# Patient Record
Sex: Female | Born: 1955 | Race: White | Hispanic: No | Marital: Married | Smoking: Former smoker
Health system: Southern US, Community
[De-identification: ages and names within clinical notes are randomized; demographics above are authoritative.]

## PROBLEM LIST (undated history)

## (undated) DIAGNOSIS — E7800 Pure hypercholesterolemia, unspecified: Secondary | ICD-10-CM

## (undated) DIAGNOSIS — N200 Calculus of kidney: Secondary | ICD-10-CM

## (undated) DIAGNOSIS — R7303 Prediabetes: Secondary | ICD-10-CM

## (undated) DIAGNOSIS — I10 Essential (primary) hypertension: Secondary | ICD-10-CM

## (undated) DIAGNOSIS — R079 Chest pain, unspecified: Secondary | ICD-10-CM

## (undated) HISTORY — PX: NECK SURGERY: SHX720

## (undated) HISTORY — DX: Essential (primary) hypertension: I10

## (undated) HISTORY — PX: EYE SURGERY: SHX253

## (undated) HISTORY — PX: LITHOTRIPSY: SUR834

## (undated) HISTORY — DX: Chest pain, unspecified: R07.9

---

## 2001-03-05 ENCOUNTER — Encounter: Admission: RE | Admit: 2001-03-05 | Discharge: 2001-03-05 | Payer: Self-pay | Admitting: Emergency Medicine

## 2001-03-05 ENCOUNTER — Encounter: Payer: Self-pay | Admitting: Emergency Medicine

## 2001-08-12 ENCOUNTER — Other Ambulatory Visit: Admission: RE | Admit: 2001-08-12 | Discharge: 2001-08-12 | Payer: Self-pay | Admitting: Emergency Medicine

## 2003-01-30 ENCOUNTER — Other Ambulatory Visit: Admission: RE | Admit: 2003-01-30 | Discharge: 2003-01-30 | Payer: Self-pay | Admitting: Emergency Medicine

## 2003-04-02 ENCOUNTER — Encounter: Admission: RE | Admit: 2003-04-02 | Discharge: 2003-04-02 | Payer: Self-pay | Admitting: Emergency Medicine

## 2004-02-12 ENCOUNTER — Other Ambulatory Visit: Admission: RE | Admit: 2004-02-12 | Discharge: 2004-02-12 | Payer: Self-pay | Admitting: Emergency Medicine

## 2004-09-19 ENCOUNTER — Encounter: Admission: RE | Admit: 2004-09-19 | Discharge: 2004-09-19 | Payer: Self-pay | Admitting: Emergency Medicine

## 2004-10-06 ENCOUNTER — Encounter: Admission: RE | Admit: 2004-10-06 | Discharge: 2005-01-04 | Payer: Self-pay | Admitting: Neurosurgery

## 2005-02-14 ENCOUNTER — Encounter: Admission: RE | Admit: 2005-02-14 | Discharge: 2005-02-14 | Payer: Self-pay | Admitting: Emergency Medicine

## 2005-02-15 ENCOUNTER — Other Ambulatory Visit: Admission: RE | Admit: 2005-02-15 | Discharge: 2005-02-15 | Payer: Self-pay | Admitting: *Deleted

## 2006-03-20 ENCOUNTER — Other Ambulatory Visit: Admission: RE | Admit: 2006-03-20 | Discharge: 2006-03-20 | Payer: Self-pay | Admitting: *Deleted

## 2006-04-26 ENCOUNTER — Ambulatory Visit (HOSPITAL_COMMUNITY): Admission: RE | Admit: 2006-04-26 | Discharge: 2006-04-26 | Payer: Self-pay | Admitting: Urology

## 2006-08-13 ENCOUNTER — Encounter: Admission: RE | Admit: 2006-08-13 | Discharge: 2006-08-13 | Payer: Self-pay | Admitting: Emergency Medicine

## 2007-07-09 ENCOUNTER — Other Ambulatory Visit: Admission: RE | Admit: 2007-07-09 | Discharge: 2007-07-09 | Payer: Self-pay | Admitting: *Deleted

## 2007-08-21 ENCOUNTER — Other Ambulatory Visit: Admission: RE | Admit: 2007-08-21 | Discharge: 2007-08-21 | Payer: Self-pay | Admitting: *Deleted

## 2007-09-09 ENCOUNTER — Encounter: Admission: RE | Admit: 2007-09-09 | Discharge: 2007-09-09 | Payer: Self-pay | Admitting: Emergency Medicine

## 2008-02-22 IMAGING — CR DG ABDOMEN 1V
1 series · 1 of 1 positions shown · non-contrast
Comparison: None.

CLINICAL DATA: Pre-lithotripsy for right renal stone.
 ABDOMEN ? 1 VIEW:

[t abdomen supine]
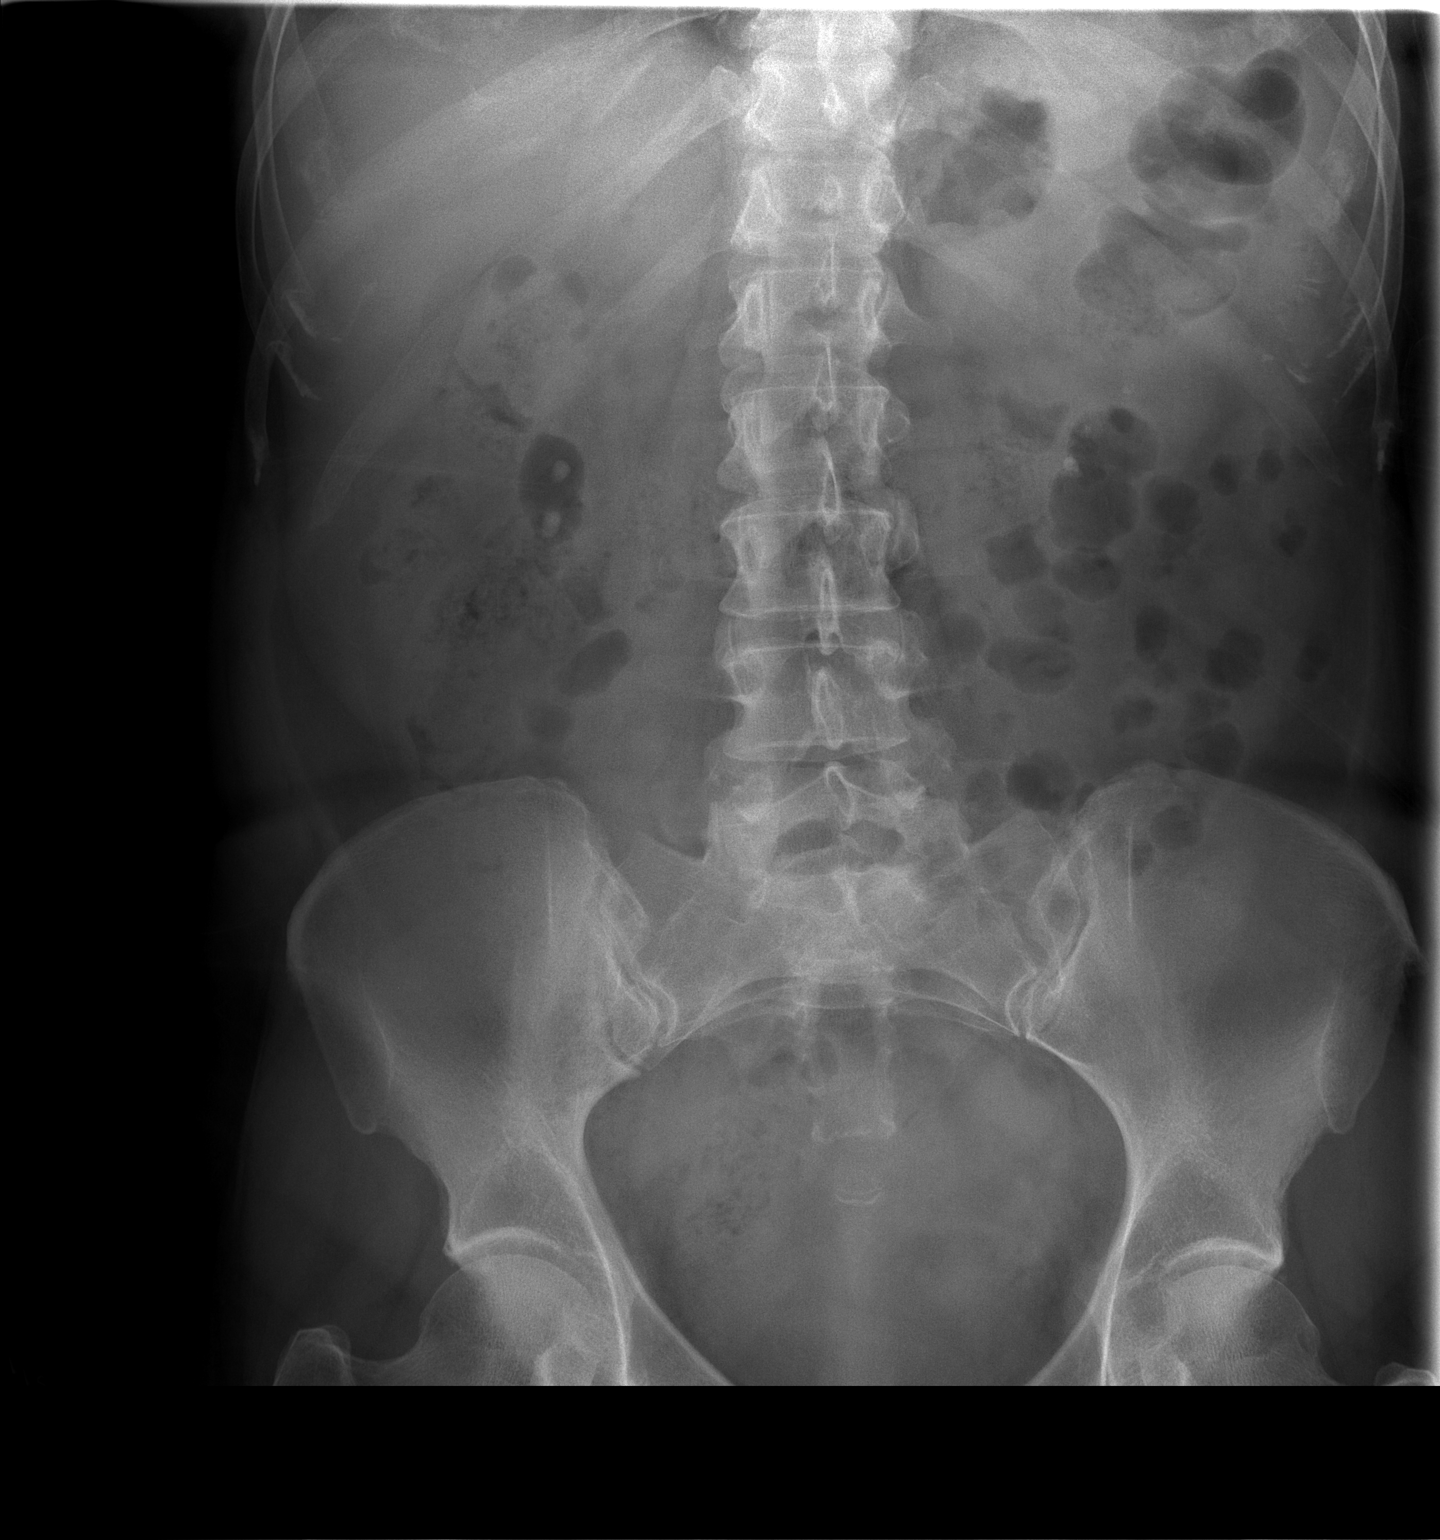

[1 of 1 positions shown; findings below may reference images not displayed]

FINDINGS: There are two calcifications projecting over the lower aspect of the right kidney, consistent with stones.  The more craniad is 6 mm.  The more caudad is about 8.5 mm.  No definite ureteral calculi.  There are also some left renal calculi.
IMPRESSION: Bilateral renal calculi.

## 2014-05-13 ENCOUNTER — Encounter: Payer: Self-pay | Admitting: *Deleted

## 2021-02-21 ENCOUNTER — Other Ambulatory Visit: Payer: Self-pay

## 2021-02-21 ENCOUNTER — Emergency Department (HOSPITAL_BASED_OUTPATIENT_CLINIC_OR_DEPARTMENT_OTHER)
Admission: EM | Admit: 2021-02-21 | Discharge: 2021-02-21 | Disposition: A | Discharge status: Home / Self Care | Payer: BC Managed Care – PPO | Attending: Student | Admitting: Student

## 2021-02-21 ENCOUNTER — Emergency Department (HOSPITAL_BASED_OUTPATIENT_CLINIC_OR_DEPARTMENT_OTHER): Payer: BC Managed Care – PPO

## 2021-02-21 ENCOUNTER — Encounter (HOSPITAL_BASED_OUTPATIENT_CLINIC_OR_DEPARTMENT_OTHER): Payer: Self-pay

## 2021-02-21 DIAGNOSIS — R1011 Right upper quadrant pain: Secondary | ICD-10-CM | POA: Diagnosis not present

## 2021-02-21 DIAGNOSIS — R1013 Epigastric pain: Secondary | ICD-10-CM | POA: Diagnosis not present

## 2021-02-21 DIAGNOSIS — I10 Essential (primary) hypertension: Secondary | ICD-10-CM | POA: Insufficient documentation

## 2021-02-21 DIAGNOSIS — Z79899 Other long term (current) drug therapy: Secondary | ICD-10-CM | POA: Insufficient documentation

## 2021-02-21 DIAGNOSIS — Z87891 Personal history of nicotine dependence: Secondary | ICD-10-CM | POA: Diagnosis not present

## 2021-02-21 DIAGNOSIS — R197 Diarrhea, unspecified: Secondary | ICD-10-CM | POA: Insufficient documentation

## 2021-02-21 DIAGNOSIS — R109 Unspecified abdominal pain: Secondary | ICD-10-CM

## 2021-02-21 HISTORY — DX: Prediabetes: R73.03

## 2021-02-21 HISTORY — DX: Calculus of kidney: N20.0

## 2021-02-21 HISTORY — DX: Pure hypercholesterolemia, unspecified: E78.00

## 2021-02-21 LAB — COMPREHENSIVE METABOLIC PANEL
ALT: 36 U/L (ref 0–44)
AST: 31 U/L (ref 15–41)
Albumin: 3.7 g/dL (ref 3.5–5.0)
Alkaline Phosphatase: 54 U/L (ref 38–126)
Anion gap: 6 (ref 5–15)
BUN: 9 mg/dL (ref 8–23)
CO2: 30 mmol/L (ref 22–32)
Calcium: 9 mg/dL (ref 8.9–10.3)
Chloride: 104 mmol/L (ref 98–111)
Creatinine, Ser: 0.44 mg/dL (ref 0.44–1.00)
GFR, Estimated: >60 mL/min (ref >60)
Glucose, Bld: 122 mg/dL — ABNORMAL HIGH (ref 70–99)
Potassium: 3.8 mmol/L (ref 3.5–5.1)
Sodium: 140 mmol/L (ref 135–145)
Total Bilirubin: 0.2 mg/dL — ABNORMAL LOW (ref 0.3–1.2)
Total Protein: 6.7 g/dL (ref 6.5–8.1)

## 2021-02-21 LAB — CBC WITH DIFFERENTIAL/PLATELET
Abs Immature Granulocytes: 0.02 10*3/uL (ref 0.00–0.07)
Basophils Absolute: 0 10*3/uL (ref 0.0–0.1)
Basophils Relative: 1 %
Eosinophils Absolute: 0.1 10*3/uL (ref 0.0–0.5)
Eosinophils Relative: 2 %
HCT: 34.6 % — ABNORMAL LOW (ref 36.0–46.0)
Hemoglobin: 11.6 g/dL — ABNORMAL LOW (ref 12.0–15.0)
Immature Granulocytes: 0 %
Lymphocytes Relative: 40 %
Lymphs Abs: 2.6 10*3/uL (ref 0.7–4.0)
MCH: 28.4 pg (ref 26.0–34.0)
MCHC: 33.5 g/dL (ref 30.0–36.0)
MCV: 84.8 fL (ref 80.0–100.0)
Monocytes Absolute: 0.4 10*3/uL (ref 0.1–1.0)
Monocytes Relative: 6 %
Neutro Abs: 3.4 10*3/uL (ref 1.7–7.7)
Neutrophils Relative %: 51 %
Platelets: 268 10*3/uL (ref 150–400)
RBC: 4.08 MIL/uL (ref 3.87–5.11)
RDW: 12.5 % (ref 11.5–15.5)
WBC: 6.6 10*3/uL (ref 4.0–10.5)
nRBC: 0 % (ref 0.0–0.2)

## 2021-02-21 LAB — LIPASE, BLOOD: Lipase: 25 U/L (ref 11–51)

## 2021-02-21 LAB — URINALYSIS, ROUTINE W REFLEX MICROSCOPIC
Bilirubin Urine: NEGATIVE
Glucose, UA: NEGATIVE mg/dL
Ketones, ur: NEGATIVE mg/dL
Leukocytes,Ua: NEGATIVE
Nitrite: NEGATIVE
Protein, ur: NEGATIVE mg/dL
Specific Gravity, Urine: 1.02 (ref 1.005–1.030)
pH: 6.5 (ref 5.0–8.0)

## 2021-02-21 LAB — URINALYSIS, MICROSCOPIC (REFLEX)

## 2021-02-21 MED ORDER — IOHEXOL 300 MG/ML  SOLN
100.0000 mL | Freq: Once | INTRAMUSCULAR | Status: AC | PRN
  Administered 2021-02-21: 100 mL via INTRAVENOUS

## 2021-02-21 MED ORDER — DICYCLOMINE HCL 20 MG PO TABS: 20.0000 mg | ORAL_TABLET | Freq: Two times a day (BID) | ORAL | 0 refills | Status: AC

## 2021-02-21 MED ORDER — KETOROLAC TROMETHAMINE 15 MG/ML IJ SOLN
15.0000 mg | Freq: Once | INTRAMUSCULAR | Status: AC
  Administered 2021-02-21: 15 mg via INTRAVENOUS
  Filled 2021-02-21: qty 1

## 2021-02-21 NOTE — ED Triage Notes (Signed)
Pt has intermittent epigastric pain and nausea x 3 days. C/o diarrhea.

## 2021-02-21 NOTE — ED Notes (Signed)
Pt return from CT.

## 2021-02-21 NOTE — ED Provider Notes (Signed)
[facility] EMERGENCY DEPARTMENT Provider Note   CSN: [id number] Arrival date & time: 02/21/21  1053     History Chief Complaint  Patient presents with   Abdominal Pain    Alyssa Hayes is a 65 y.o. female who presents the emergency department for evaluation of abdominal pain.  Patient states that she has had intermittent epigastric and right upper quadrant abdominal pain for the last 4 days.  It initially improved but has now returned.  She states that her son is getting married this weekend and she is going on vacation soon and she wants to ensure that she does not have any significant pathology that would interrupt these activities.  She denies fever, chest pain, shortness of breath, cough or other systemic symptoms.  She endorses intermittent nonbloody diarrhea.   Abdominal Pain Associated symptoms: diarrhea   Associated symptoms: no chest pain, no chills, no cough, no dysuria, no fever, no hematuria, no shortness of breath, no sore throat and no vomiting       Past Medical History:  Diagnosis Date   Chest pain    High cholesterol    Hypertension    Kidney stone    Pre-diabetes     There are no problems to display for this patient.   Past Surgical History:  Procedure Laterality Date   CESAREAN SECTION     EYE SURGERY     LITHOTRIPSY     NECK SURGERY       OB History   No obstetric history on file.     Family History  Problem Relation Age of Onset   Diabetes Father     Social History   Tobacco Use   Smoking status: Former   Smokeless tobacco: Never  Building services engineer Use: Never used  Substance Use Topics   Alcohol use: Yes   Drug use: No    Home Medications Prior to Admission medications   Medication Sig Start Date End Date Taking? Authorizing Provider  dicyclomine (BENTYL) 20 MG tablet Take 1 tablet (20 mg total) by mouth 2 (two) times daily. 02/21/21  Yes Makih Stefanko, MD  buPROPion (WELLBUTRIN SR) 100 MG 12 hr tablet Take 100  mg by mouth 2 (two) times daily.    [provider]  cholecalciferol (VITAMIN D) 1000 UNITS tablet Take 1,000 Units by mouth daily.    [provider]  Flaxseed, Linseed, (FLAX SEED OIL) 1000 MG CAPS Take by mouth.    [provider]  lisinopril-hydrochlorothiazide (PRINZIDE,ZESTORETIC) 10-12.5 MG per tablet Take 1 tablet by mouth daily.    [provider]  metFORMIN (GLUCOPHAGE) 500 MG tablet Take by mouth 2 (two) times daily with a meal.    [provider]  Multiple Vitamin (MULTIVITAMIN) capsule Take 1 capsule by mouth daily.    [provider]  omeprazole (PRILOSEC) 20 MG capsule Take 20 mg by mouth daily.    [provider]  sertraline (ZOLOFT) 50 MG tablet Take 50 mg by mouth daily.    [provider]    Allergies    Erythromycin  Review of Systems   Review of Systems  Constitutional:  Negative for chills and fever.  HENT:  Negative for ear pain and sore throat.   Eyes:  Negative for pain and visual disturbance.  Respiratory:  Negative for cough and shortness of breath.   Cardiovascular:  Negative for chest pain and palpitations.  Gastrointestinal:  Positive for abdominal pain and diarrhea. Negative for vomiting.  Genitourinary:  Negative for dysuria and hematuria.  Musculoskeletal:  Negative for arthralgias and back pain.  Skin:  Negative for color change and rash.  Neurological:  Negative for seizures and syncope.  All other systems reviewed and are negative.  Physical Exam Updated Vital Signs BP 128/74 (BP Location: Left Arm)   Pulse 73   Temp 98.4 F (36.9 C) (Oral)   Resp 14   Ht 5\' 1"  (1.549 m)   Wt 62.1 kg   SpO2 99%   BMI 25.89 kg/m   Physical Exam Vitals and nursing note reviewed.  Constitutional:      General: She is not in acute distress.    Appearance: She is well-developed.  HENT:     Head: Normocephalic and atraumatic.  Eyes:     Conjunctiva/sclera: Conjunctivae normal.   Cardiovascular:     Rate and Rhythm: Normal rate and regular rhythm.     Heart sounds: No murmur heard. Pulmonary:     Effort: Pulmonary effort is normal. No respiratory distress.     Breath sounds: Normal breath sounds.  Abdominal:     Palpations: Abdomen is soft.     Tenderness: There is abdominal tenderness in the right upper quadrant and epigastric area.  Musculoskeletal:     Cervical back: Neck supple.  Skin:    General: Skin is warm and dry.  Neurological:     Mental Status: She is alert.    ED Results / Procedures / Treatments   Labs (all labs ordered are listed, but only abnormal results are displayed) Labs Reviewed  CBC WITH DIFFERENTIAL/PLATELET - Abnormal; Notable for the following components:      Result Value   Hemoglobin 11.6 (*)    HCT 34.6 (*)    All other components within normal limits  COMPREHENSIVE METABOLIC PANEL - Abnormal; Notable for the following components:   Glucose, Bld 122 (*)    Total Bilirubin 0.2 (*)    All other components within normal limits  URINALYSIS, ROUTINE W REFLEX MICROSCOPIC - Abnormal; Notable for the following components:   Hgb urine dipstick SMALL (*)    All other components within normal limits  URINALYSIS, MICROSCOPIC (REFLEX) - Abnormal; Notable for the following components:   Bacteria, UA RARE (*)    All other components within normal limits  LIPASE, BLOOD    EKG None  Radiology CT ABDOMEN PELVIS W CONTRAST  Result Date: 02/21/2021 CLINICAL DATA:  Epigastric and right upper quadrant pain, nausea, diarrhea EXAM: CT ABDOMEN AND PELVIS WITH CONTRAST TECHNIQUE: Multidetector CT imaging of the abdomen and pelvis was performed using the standard protocol following bolus administration of intravenous contrast. CONTRAST:  04/23/2021 OMNIPAQUE IOHEXOL 300 MG/ML  SOLN COMPARISON:  None. FINDINGS: Lower chest: No acute abnormality. Hepatobiliary: No solid liver abnormality is seen. Hepatic steatosis. No gallstones, gallbladder wall  thickening, or biliary dilatation. Pancreas: Unremarkable. No pancreatic ductal dilatation or surrounding inflammatory changes. Spleen: Normal in size without significant abnormality. Adrenals/Urinary Tract: Adrenal glands are unremarkable. Multiple bilateral nonobstructive renal calculi. No ureteral calculi or hydronephrosis. Bladder is unremarkable. Stomach/Bowel: Stomach is within normal limits. Appendix appears normal. No evidence of bowel wall thickening, distention, or inflammatory changes. Vascular/Lymphatic: Aortic atherosclerosis. No enlarged abdominal or pelvic lymph nodes. Reproductive: No mass or other significant abnormality. Other: No abdominal wall hernia or abnormality. No abdominopelvic ascites. Musculoskeletal: No acute or significant osseous findings. IMPRESSION: 1. No acute CT findings of the abdomen or pelvis to explain epigastric or right upper quadrant pain. 2. Hepatic steatosis.  3. Multiple bilateral nonobstructive renal calculi. No ureteral calculi or hydronephrosis. Aortic Atherosclerosis (ICD10-I70.0). Electronically Signed   By: Jearld Lesch M.D.   On: 02/21/2021 15:25    Procedures Procedures   Medications Ordered in ED Medications  ketorolac (TORADOL) 15 MG/ML injection 15 mg (15 mg Intravenous Given 02/21/21 1416)  iohexol (OMNIPAQUE) 300 MG/ML solution 100 mL (100 mLs Intravenous Contrast Given 02/21/21 1454)    ED Course  I have reviewed the triage vital signs and the nursing notes.  Pertinent labs & imaging results that were available during my care of the patient were reviewed by me and considered in my medical decision making (see chart for details).    MDM Rules/Calculators/A&P                           Patient seen emergency department for evaluation abdominal pain.  Physical exam is largely unremarkable.  Laboratory evaluation unremarkable.  CT abdomen pelvis unremarkable. Patient given Toradol with improvement of symptoms.  Patient presentation likely  consistent with gastroenteritis.  She was discharged with a prescription for Bentyl and given return precautions which she voiced understanding. Final Clinical Impression(s) / ED Diagnoses Final diagnoses:  Abdominal pain, unspecified abdominal location    Rx / DC Orders ED Discharge Orders          Ordered    dicyclomine (BENTYL) 20 MG tablet  2 times daily        02/21/21 1528             Dontrail Blackwell, Wyn Forster, MD 02/21/21 1617

## 2021-02-21 NOTE — ED Notes (Signed)
Pt NAD, a/ox4. Pt verbalizes understanding of all DC and f/u instructions. All questions answered. Pt walks with steady gait to lobby at DC.  ? ?

## 2021-02-21 NOTE — ED Notes (Signed)
Patient reports intermittent upper abdominal pain since Friday, nasuea but no vomiting, diarrhea also but has been improving with imodium per patient.

## 2022-12-20 IMAGING — CT CT ABD-PELV W/ CM
2 of 5 series · 16 of 46 positions shown, 18 images · IV contrast (omnipaque)
Comparison: None.

CLINICAL DATA: Epigastric and right upper quadrant pain, nausea,
diarrhea

EXAM:
CT ABDOMEN AND PELVIS WITH CONTRAST
TECHNIQUE: Multidetector CT imaging of the abdomen and pelvis was performed
using the standard protocol following bolus administration of
intravenous contrast.
CONTRAST:  100mL OMNIPAQUE IOHEXOL 300 MG/ML  SOLN

[Series 2: axial st · axial · 0.85mm/px · z∈[-423,-63]mm · 13 of 82 slices shown, 15 images]
[im 5/82  soft-tissue]
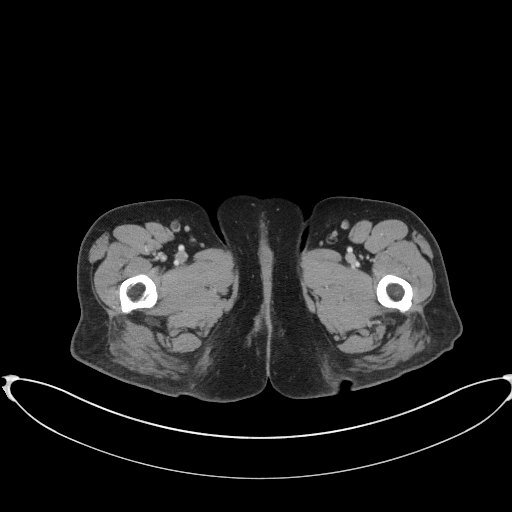
[im 5/82  bone]
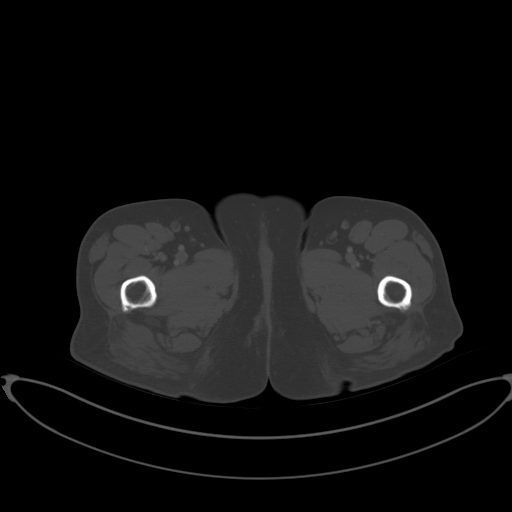
[im 13/82  soft-tissue]
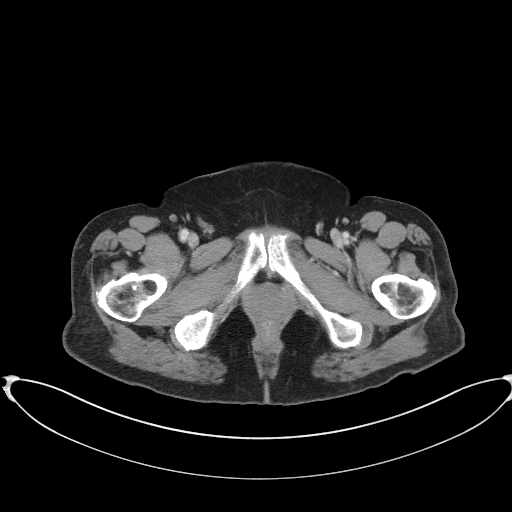
[im 17/82  soft-tissue]
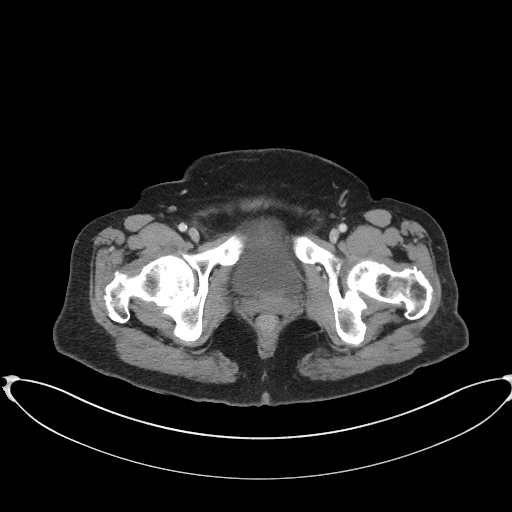
[im 25/82  soft-tissue]
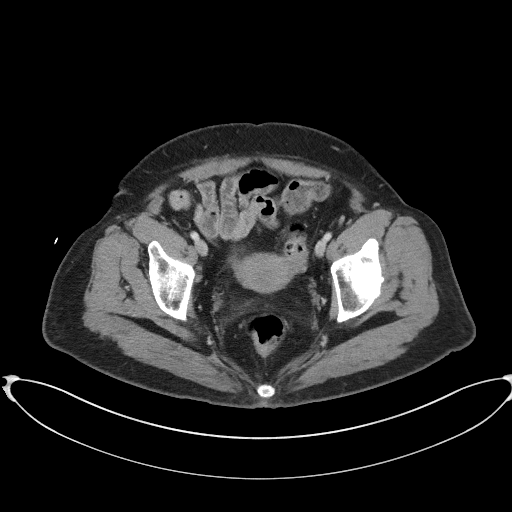
[im 29/82  soft-tissue]
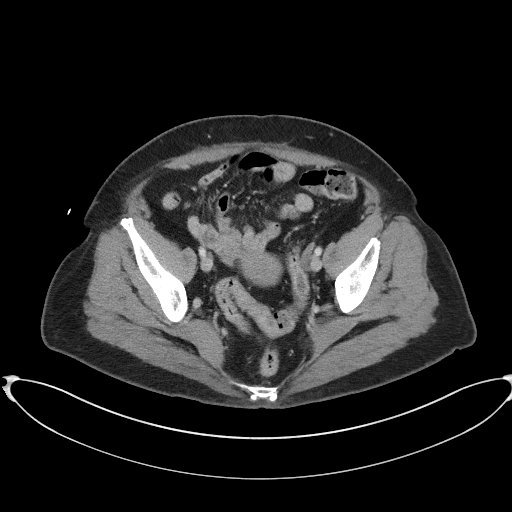
[im 37/82  soft-tissue]
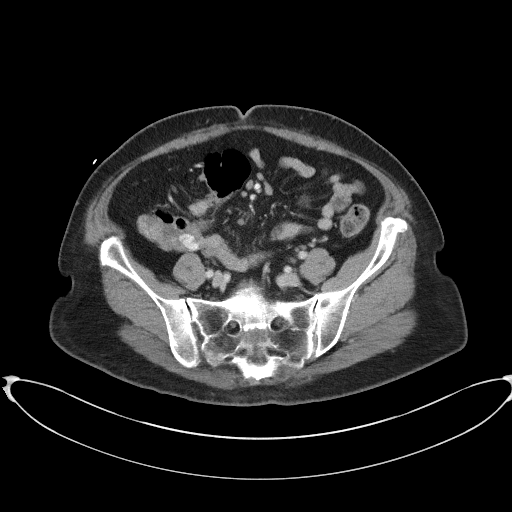
[im 41/82  soft-tissue]
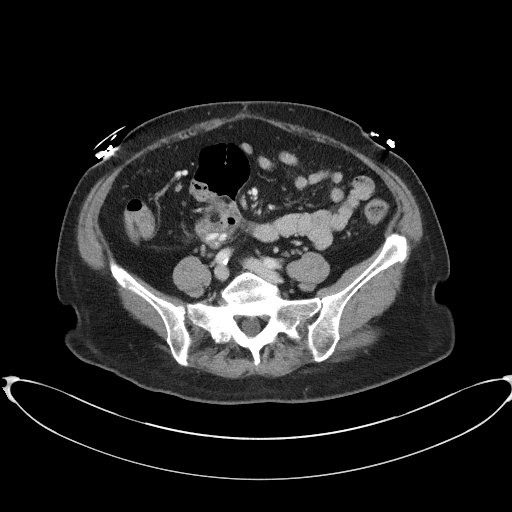
[im 45/82  soft-tissue]
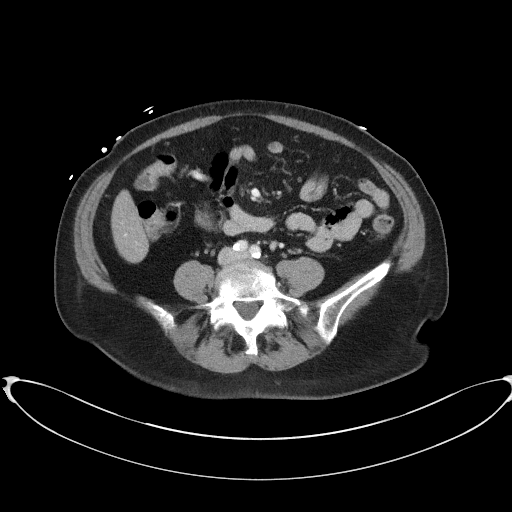
[im 53/82  soft-tissue]
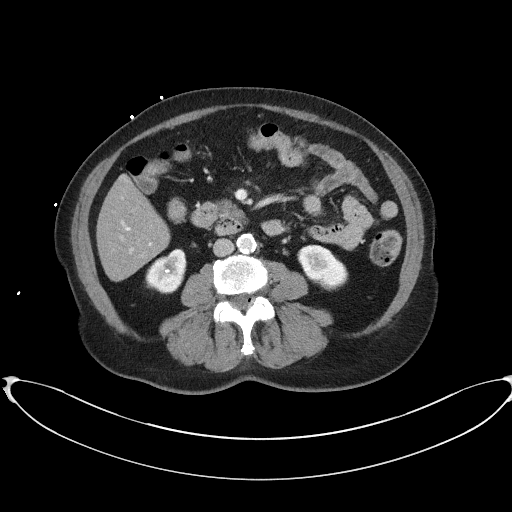
[im 53/82  bone]
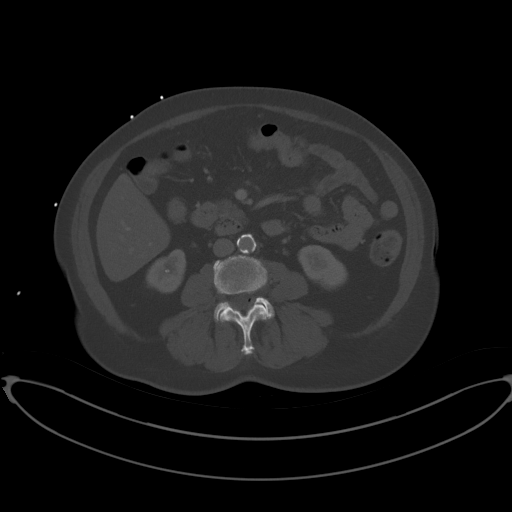
[im 57/82  soft-tissue]
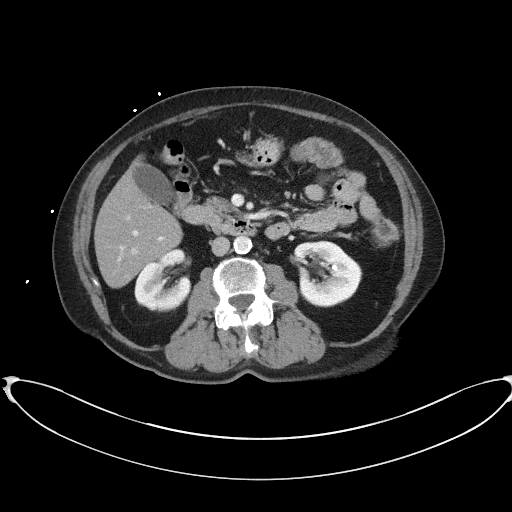
[im 65/82  soft-tissue]
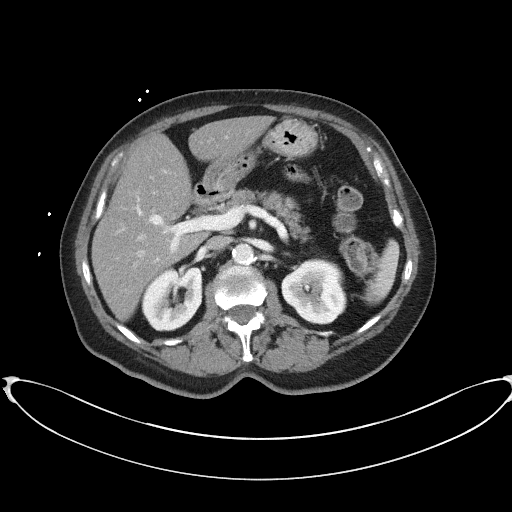
[im 69/82  soft-tissue]
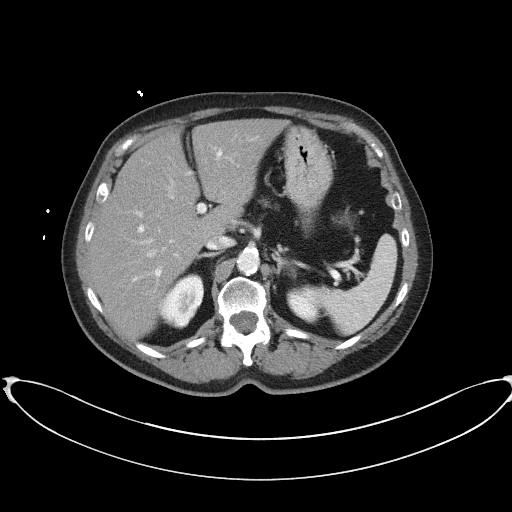
[im 77/82  soft-tissue]
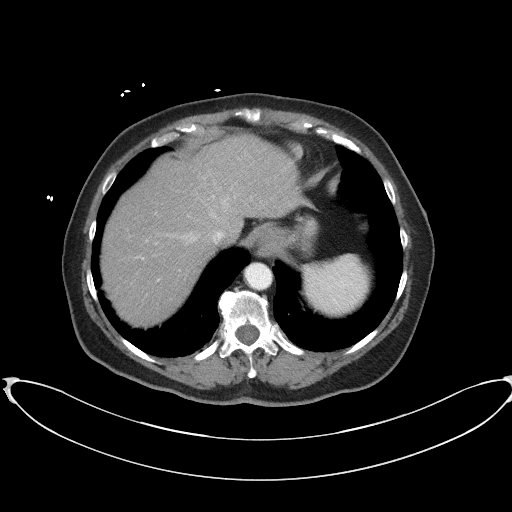

[Series 5: coronal st · coronal · 0.72mm/px · 3 of 92 slices shown]
[im 31/92  soft-tissue]
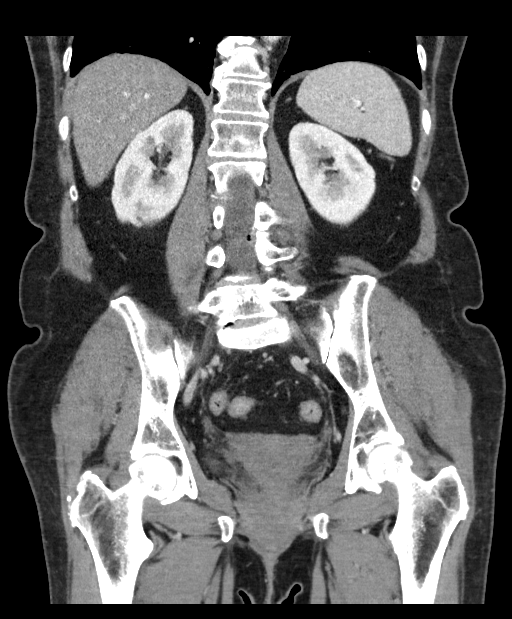
[im 41/92  soft-tissue]
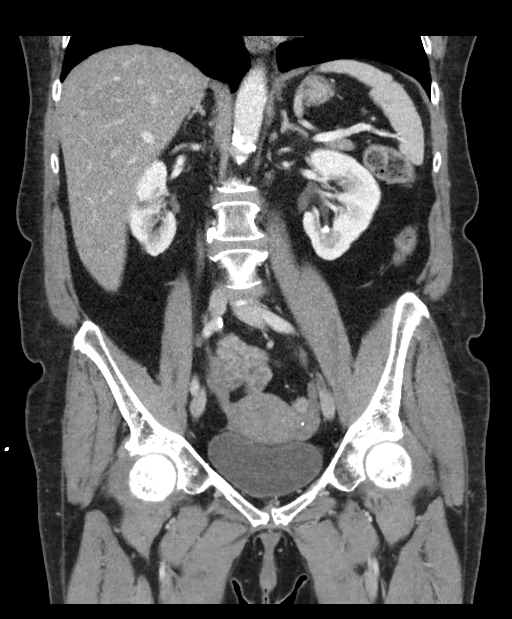
[im 51/92  soft-tissue]
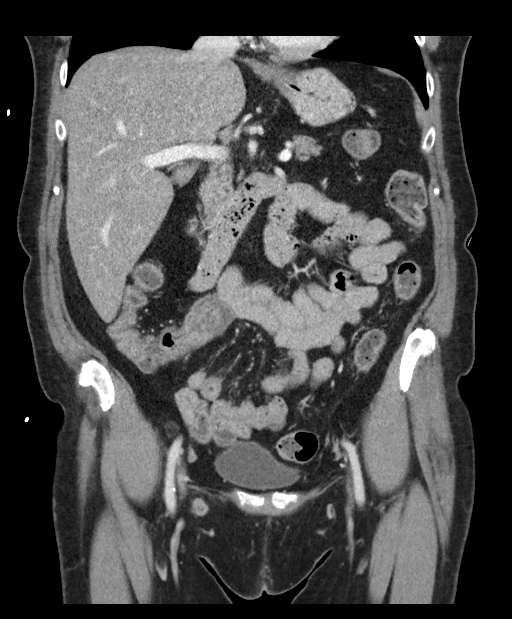

[16 of 46 positions shown; findings below may reference images not displayed]

FINDINGS: Lower chest: No acute abnormality.

Hepatobiliary: No solid liver abnormality is seen. Hepatic
steatosis. No gallstones, gallbladder wall thickening, or biliary
dilatation.

Pancreas: Unremarkable. No pancreatic ductal dilatation or
surrounding inflammatory changes.

Spleen: Normal in size without significant abnormality.

Adrenals/Urinary Tract: Adrenal glands are unremarkable. Multiple
bilateral nonobstructive renal calculi. No ureteral calculi or
hydronephrosis. Bladder is unremarkable.

Stomach/Bowel: Stomach is within normal limits. Appendix appears
normal. No evidence of bowel wall thickening, distention, or
inflammatory changes.

Vascular/Lymphatic: Aortic atherosclerosis. No enlarged abdominal or
pelvic lymph nodes.

Reproductive: No mass or other significant abnormality.

Other: No abdominal wall hernia or abnormality. No abdominopelvic
ascites.

Musculoskeletal: No acute or significant osseous findings.
IMPRESSION: 1. No acute CT findings of the abdomen or pelvis to explain
epigastric or right upper quadrant pain.
2. Hepatic steatosis.
3. Multiple bilateral nonobstructive renal calculi. No ureteral
calculi or hydronephrosis.

Aortic Atherosclerosis ([id number]).
